# Patient Record
Sex: Female | Born: 1955 | Race: White | Hispanic: No | Marital: Married
Health system: Southern US, Community
[De-identification: ages and names within clinical notes are randomized; demographics above are authoritative.]

## PROBLEM LIST (undated history)

## (undated) DIAGNOSIS — I10 Essential (primary) hypertension: Secondary | ICD-10-CM

## (undated) DIAGNOSIS — E785 Hyperlipidemia, unspecified: Secondary | ICD-10-CM

## (undated) HISTORY — DX: Hyperlipidemia, unspecified: E78.5

## (undated) HISTORY — DX: Essential (primary) hypertension: I10

---

## 2005-04-27 ENCOUNTER — Ambulatory Visit: Payer: Self-pay | Admitting: Internal Medicine

## 2006-05-05 ENCOUNTER — Ambulatory Visit: Payer: Self-pay | Admitting: Internal Medicine

## 2007-08-09 ENCOUNTER — Ambulatory Visit: Payer: Self-pay | Admitting: Internal Medicine

## 2008-12-06 ENCOUNTER — Ambulatory Visit: Payer: Self-pay | Admitting: Internal Medicine

## 2010-06-09 ENCOUNTER — Ambulatory Visit: Payer: Self-pay | Admitting: Internal Medicine

## 2011-12-27 ENCOUNTER — Ambulatory Visit: Payer: Self-pay | Admitting: Internal Medicine

## 2013-06-15 ENCOUNTER — Ambulatory Visit: Payer: Self-pay | Admitting: Internal Medicine

## 2014-08-30 ENCOUNTER — Ambulatory Visit: Payer: Self-pay | Admitting: Internal Medicine

## 2015-01-15 ENCOUNTER — Other Ambulatory Visit: Payer: Self-pay | Admitting: Orthopedic Surgery

## 2015-01-15 DIAGNOSIS — M25561 Pain in right knee: Secondary | ICD-10-CM

## 2015-01-23 ENCOUNTER — Ambulatory Visit: Payer: Self-pay

## 2015-01-24 ENCOUNTER — Ambulatory Visit: Payer: Self-pay

## 2015-08-08 ENCOUNTER — Other Ambulatory Visit: Payer: Self-pay | Admitting: Internal Medicine

## 2015-08-08 DIAGNOSIS — Z0000 Encounter for general adult medical examination without abnormal findings: Secondary | ICD-10-CM

## 2015-09-02 ENCOUNTER — Ambulatory Visit
Admission: RE | Admit: 2015-09-02 | Discharge: 2015-09-02 | Disposition: A | Discharge status: Home / Self Care | Payer: BLUE CROSS/BLUE SHIELD | Source: Ambulatory Visit | Attending: Internal Medicine | Admitting: Internal Medicine

## 2015-09-02 DIAGNOSIS — Z1231 Encounter for screening mammogram for malignant neoplasm of breast: Secondary | ICD-10-CM | POA: Insufficient documentation

## 2015-09-02 DIAGNOSIS — Z0000 Encounter for general adult medical examination without abnormal findings: Secondary | ICD-10-CM

## 2016-08-02 ENCOUNTER — Other Ambulatory Visit: Payer: Self-pay | Admitting: Internal Medicine

## 2016-09-22 ENCOUNTER — Other Ambulatory Visit: Payer: Self-pay | Admitting: Internal Medicine

## 2016-09-22 DIAGNOSIS — Z1231 Encounter for screening mammogram for malignant neoplasm of breast: Secondary | ICD-10-CM

## 2016-12-03 ENCOUNTER — Ambulatory Visit
Admission: RE | Admit: 2016-12-03 | Discharge: 2016-12-03 | Disposition: A | Discharge status: Home / Self Care | Payer: BLUE CROSS/BLUE SHIELD | Source: Ambulatory Visit | Attending: Internal Medicine | Admitting: Internal Medicine

## 2016-12-03 ENCOUNTER — Encounter: Payer: Self-pay | Admitting: Radiology

## 2016-12-03 DIAGNOSIS — Z1231 Encounter for screening mammogram for malignant neoplasm of breast: Secondary | ICD-10-CM | POA: Insufficient documentation

## 2018-03-21 ENCOUNTER — Other Ambulatory Visit: Payer: Self-pay | Admitting: Nurse Practitioner

## 2018-03-21 DIAGNOSIS — Z1231 Encounter for screening mammogram for malignant neoplasm of breast: Secondary | ICD-10-CM

## 2018-05-03 ENCOUNTER — Ambulatory Visit: Payer: BLUE CROSS/BLUE SHIELD

## 2019-09-03 ENCOUNTER — Other Ambulatory Visit: Payer: Self-pay | Admitting: Nurse Practitioner

## 2019-09-03 DIAGNOSIS — Z1231 Encounter for screening mammogram for malignant neoplasm of breast: Secondary | ICD-10-CM

## 2019-09-14 ENCOUNTER — Ambulatory Visit
Admission: RE | Admit: 2019-09-14 | Discharge: 2019-09-14 | Disposition: A | Discharge status: Home / Self Care | Payer: BLUE CROSS/BLUE SHIELD | Source: Ambulatory Visit | Attending: Nurse Practitioner | Admitting: Nurse Practitioner

## 2019-09-14 DIAGNOSIS — Z1231 Encounter for screening mammogram for malignant neoplasm of breast: Secondary | ICD-10-CM | POA: Insufficient documentation

## 2019-09-15 ENCOUNTER — Ambulatory Visit: Payer: BLUE CROSS/BLUE SHIELD | Attending: Internal Medicine

## 2019-09-15 DIAGNOSIS — Z23 Encounter for immunization: Secondary | ICD-10-CM

## 2019-09-15 NOTE — Progress Notes (Signed)
   Covid-19 Vaccination Clinic  Name:  Kathleen Barrera    MRN: [id number] DOB: January 01, 1956  09/15/2019  [name] was observed post Covid-19 immunization for 15 minutes without incident. She was provided with Vaccine Information Sheet and instruction to access the V-Safe system.   [name] was instructed to call 911 with any severe reactions post vaccine: Marland Kitchen Difficulty breathing  . Swelling of face and throat  . A fast heartbeat  . A bad rash all over body  . Dizziness and weakness   Immunizations Administered    Name Date Dose VIS Date Route   Moderna COVID-19 Vaccine 09/15/2019 10:59 AM 0.5 mL 05/29/2019 Intramuscular   Manufacturer: Moderna   Lot: [id number]   NDC: 73736-681-59

## 2019-10-17 ENCOUNTER — Ambulatory Visit: Payer: Self-pay | Attending: Internal Medicine

## 2019-10-17 DIAGNOSIS — Z23 Encounter for immunization: Secondary | ICD-10-CM

## 2019-10-17 NOTE — Progress Notes (Signed)
   Covid-19 Vaccination Clinic  Name:  JACQUELINNE SPEAK    MRN: [id number] DOB: Jul 07, 1955  10/17/2019  Ms. Hoeppner was observed post Covid-19 immunization for 15 minutes without incident. She was provided with Vaccine Information Sheet and instruction to access the V-Safe system.   Ms. Pelle was instructed to call 911 with any severe reactions post vaccine: Marland Kitchen Difficulty breathing  . Swelling of face and throat  . A fast heartbeat  . A bad rash all over body  . Dizziness and weakness   Immunizations Administered    Name Date Dose VIS Date Route   Moderna COVID-19 Vaccine 10/17/2019 10:58 AM 0.5 mL 05/2019 Intramuscular   Manufacturer: Moderna   Lot: [id number]   NDC: 39532-023-34

## 2021-03-22 IMAGING — MG DIGITAL SCREENING BILAT W/ TOMO W/ CAD
8 series · 8 of 24 positions shown · non-contrast
Comparison: Previous exam(s).

ACR Breast Density Category a: The breast tissue is almost entirely
fatty.

CLINICAL DATA: Screening.

EXAM:
DIGITAL SCREENING BILATERAL MAMMOGRAM WITH TOMO AND CAD

[L MLO synth-2D]
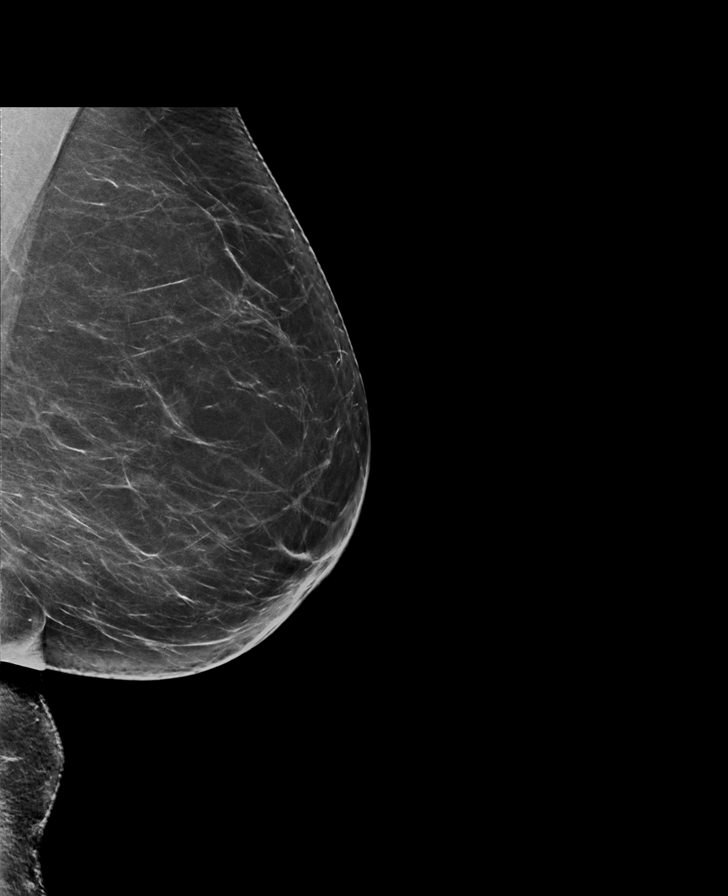

[R MLO synth-2D]
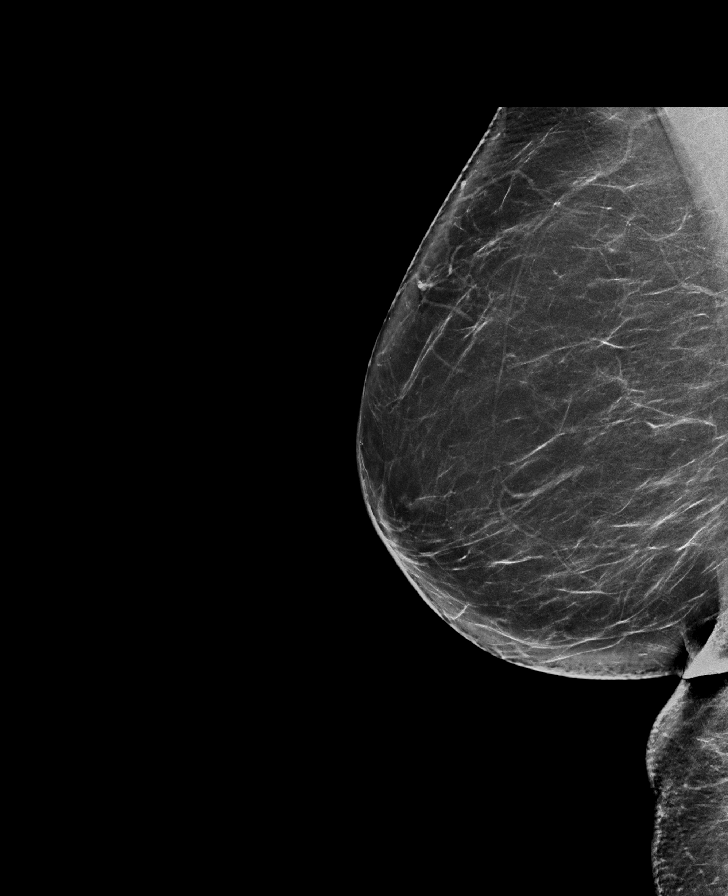

[R CC synth-2D]
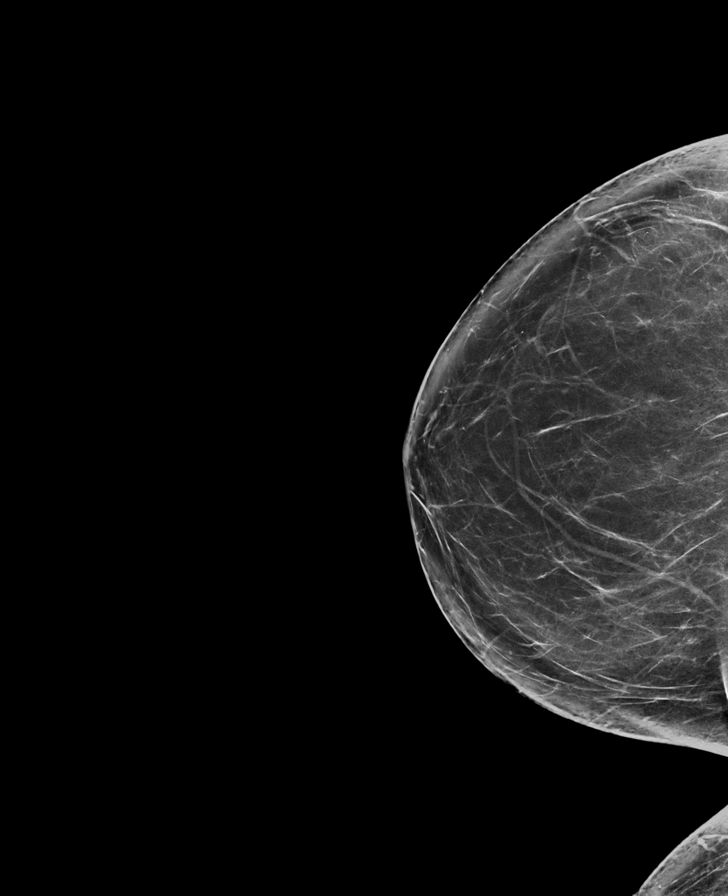

[L CC synth-2D]
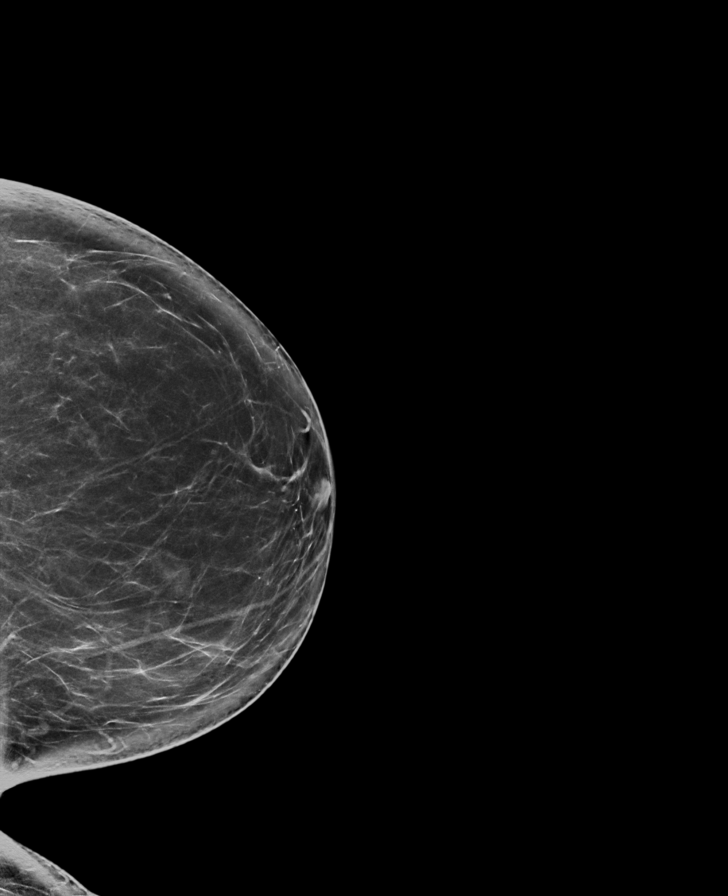

[L CC tomo · tomo slice 39/77.0]
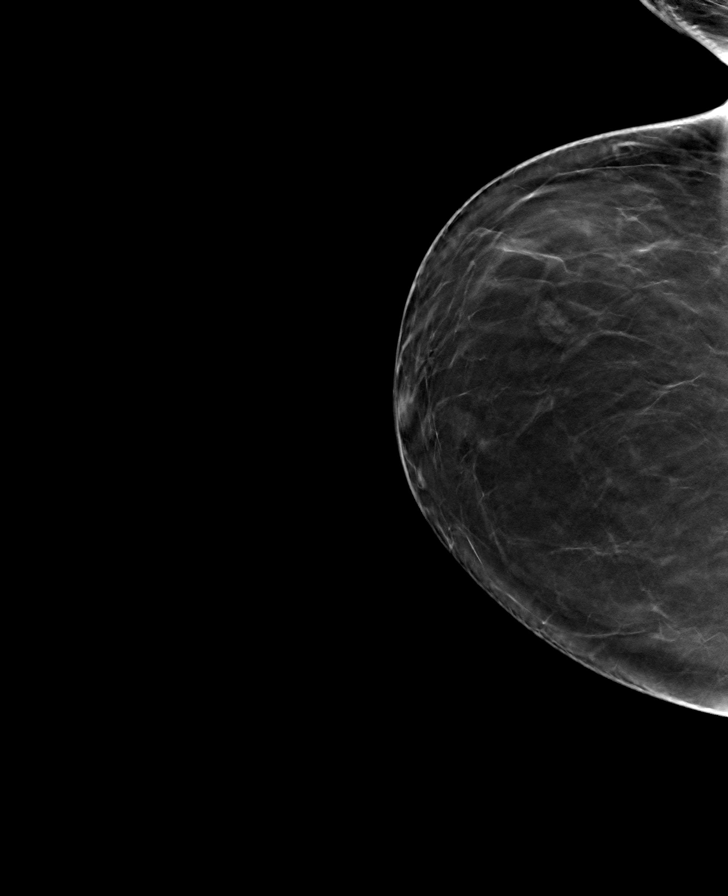

[L MLO tomo · tomo slice 41/81.0]
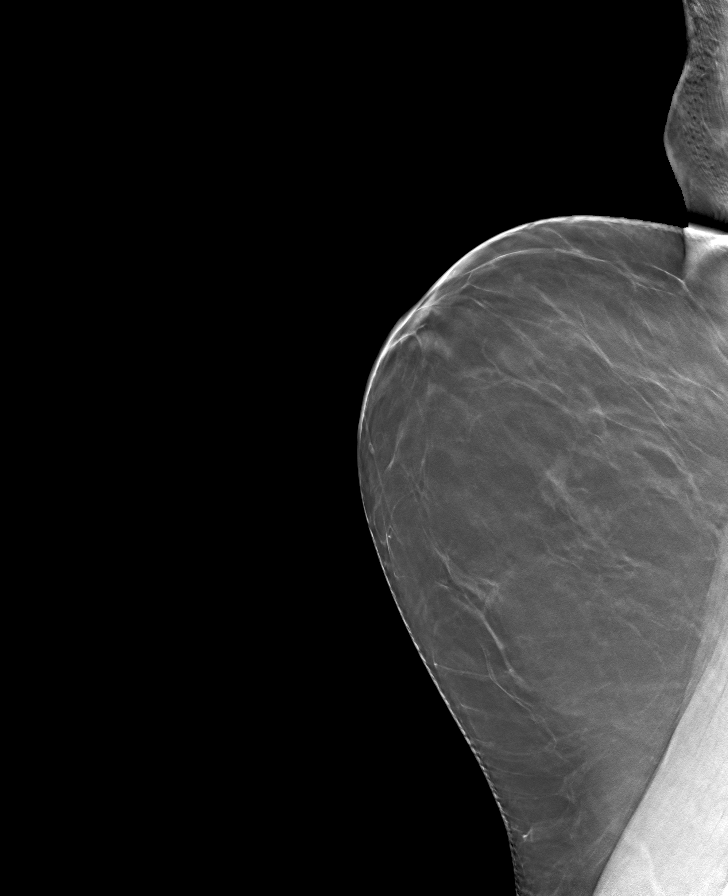

[R CC tomo · tomo slice 39/77.0]
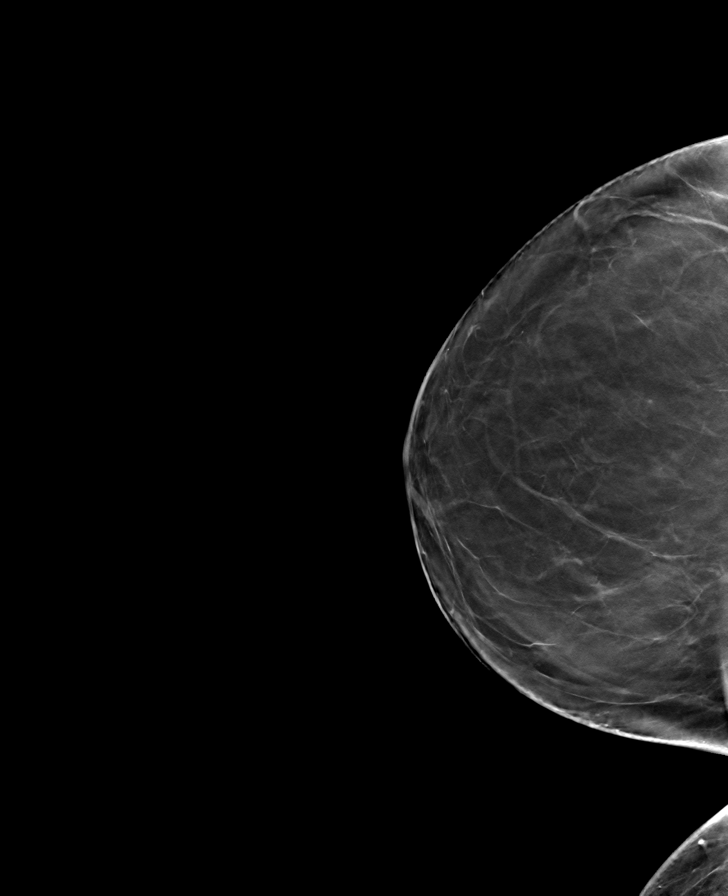

[R MLO tomo · tomo slice 41/82.0]
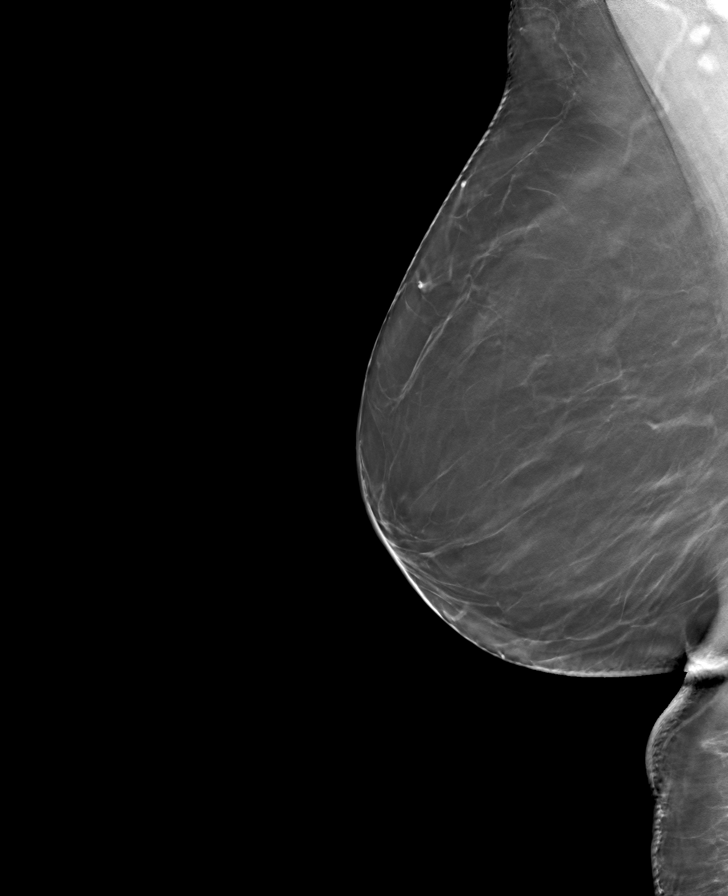

[8 of 24 positions shown; findings below may reference images not displayed]

FINDINGS: There are no findings suspicious for malignancy. Images were
processed with CAD.
IMPRESSION: No mammographic evidence of malignancy. A result letter of this
screening mammogram will be mailed directly to the patient.

RECOMMENDATION:
Screening mammogram in one year. (Code:[id number])

BI-RADS CATEGORY  1: Negative.

## 2021-04-03 DIAGNOSIS — R7301 Impaired fasting glucose: Secondary | ICD-10-CM | POA: Diagnosis not present

## 2021-04-03 DIAGNOSIS — E785 Hyperlipidemia, unspecified: Secondary | ICD-10-CM | POA: Diagnosis not present

## 2021-04-03 DIAGNOSIS — I10 Essential (primary) hypertension: Secondary | ICD-10-CM | POA: Diagnosis not present

## 2021-04-10 ENCOUNTER — Other Ambulatory Visit: Payer: Self-pay | Admitting: Nurse Practitioner

## 2021-04-10 DIAGNOSIS — Z23 Encounter for immunization: Secondary | ICD-10-CM | POA: Diagnosis not present

## 2021-04-10 DIAGNOSIS — E785 Hyperlipidemia, unspecified: Secondary | ICD-10-CM | POA: Diagnosis not present

## 2021-04-10 DIAGNOSIS — F32A Depression, unspecified: Secondary | ICD-10-CM | POA: Diagnosis not present

## 2021-04-10 DIAGNOSIS — R7301 Impaired fasting glucose: Secondary | ICD-10-CM | POA: Diagnosis not present

## 2021-04-10 DIAGNOSIS — Z1231 Encounter for screening mammogram for malignant neoplasm of breast: Secondary | ICD-10-CM

## 2021-04-10 DIAGNOSIS — I10 Essential (primary) hypertension: Secondary | ICD-10-CM | POA: Diagnosis not present

## 2021-04-10 DIAGNOSIS — F411 Generalized anxiety disorder: Secondary | ICD-10-CM | POA: Diagnosis not present

## 2021-04-10 DIAGNOSIS — M1990 Unspecified osteoarthritis, unspecified site: Secondary | ICD-10-CM | POA: Diagnosis not present

## 2021-04-28 ENCOUNTER — Other Ambulatory Visit: Payer: Self-pay

## 2021-04-28 ENCOUNTER — Ambulatory Visit
Admission: RE | Admit: 2021-04-28 | Discharge: 2021-04-28 | Disposition: A | Discharge status: Home / Self Care | Payer: PPO | Source: Ambulatory Visit | Attending: Nurse Practitioner | Admitting: Nurse Practitioner

## 2021-04-28 DIAGNOSIS — Z1231 Encounter for screening mammogram for malignant neoplasm of breast: Secondary | ICD-10-CM | POA: Diagnosis not present

## 2021-10-16 DIAGNOSIS — R7301 Impaired fasting glucose: Secondary | ICD-10-CM | POA: Diagnosis not present

## 2021-10-16 DIAGNOSIS — I10 Essential (primary) hypertension: Secondary | ICD-10-CM | POA: Diagnosis not present

## 2021-10-16 DIAGNOSIS — E785 Hyperlipidemia, unspecified: Secondary | ICD-10-CM | POA: Diagnosis not present

## 2021-10-23 DIAGNOSIS — I10 Essential (primary) hypertension: Secondary | ICD-10-CM | POA: Diagnosis not present

## 2021-10-23 DIAGNOSIS — E785 Hyperlipidemia, unspecified: Secondary | ICD-10-CM | POA: Diagnosis not present

## 2021-10-23 DIAGNOSIS — F411 Generalized anxiety disorder: Secondary | ICD-10-CM | POA: Diagnosis not present

## 2021-10-23 DIAGNOSIS — R7303 Prediabetes: Secondary | ICD-10-CM | POA: Diagnosis not present

## 2022-03-16 DIAGNOSIS — B372 Candidiasis of skin and nail: Secondary | ICD-10-CM | POA: Diagnosis not present

## 2022-03-16 DIAGNOSIS — B029 Zoster without complications: Secondary | ICD-10-CM | POA: Diagnosis not present

## 2022-03-16 DIAGNOSIS — I10 Essential (primary) hypertension: Secondary | ICD-10-CM | POA: Diagnosis not present

## 2022-03-16 DIAGNOSIS — R7303 Prediabetes: Secondary | ICD-10-CM | POA: Diagnosis not present

## 2022-04-23 DIAGNOSIS — R7303 Prediabetes: Secondary | ICD-10-CM | POA: Diagnosis not present

## 2022-04-23 DIAGNOSIS — E785 Hyperlipidemia, unspecified: Secondary | ICD-10-CM | POA: Diagnosis not present

## 2022-04-23 DIAGNOSIS — I10 Essential (primary) hypertension: Secondary | ICD-10-CM | POA: Diagnosis not present

## 2022-04-30 DIAGNOSIS — Z0001 Encounter for general adult medical examination with abnormal findings: Secondary | ICD-10-CM | POA: Diagnosis not present

## 2022-04-30 DIAGNOSIS — R7303 Prediabetes: Secondary | ICD-10-CM | POA: Diagnosis not present

## 2022-04-30 DIAGNOSIS — J302 Other seasonal allergic rhinitis: Secondary | ICD-10-CM | POA: Diagnosis not present

## 2022-04-30 DIAGNOSIS — I10 Essential (primary) hypertension: Secondary | ICD-10-CM | POA: Diagnosis not present

## 2022-04-30 DIAGNOSIS — J309 Allergic rhinitis, unspecified: Secondary | ICD-10-CM | POA: Diagnosis not present

## 2022-04-30 DIAGNOSIS — E785 Hyperlipidemia, unspecified: Secondary | ICD-10-CM | POA: Diagnosis not present

## 2022-08-26 ENCOUNTER — Other Ambulatory Visit (INDEPENDENT_AMBULATORY_CARE_PROVIDER_SITE_OTHER): Payer: PPO | Admitting: Nurse Practitioner

## 2022-08-26 ENCOUNTER — Ambulatory Visit (INDEPENDENT_AMBULATORY_CARE_PROVIDER_SITE_OTHER): Payer: PPO | Admitting: Nurse Practitioner

## 2022-08-26 VITALS — BP 126/82 | HR 92 | Ht 64.0 in | Wt 145.0 lb

## 2022-08-26 DIAGNOSIS — M778 Other enthesopathies, not elsewhere classified: Secondary | ICD-10-CM

## 2022-08-26 DIAGNOSIS — G5601 Carpal tunnel syndrome, right upper limb: Secondary | ICD-10-CM | POA: Insufficient documentation

## 2022-08-26 DIAGNOSIS — M25511 Pain in right shoulder: Secondary | ICD-10-CM | POA: Insufficient documentation

## 2022-08-26 HISTORY — DX: Other enthesopathies, not elsewhere classified: M77.8

## 2022-08-26 HISTORY — DX: Pain in right shoulder: M25.511

## 2022-08-26 MED ORDER — METHYLPREDNISOLONE ACETATE 40 MG/ML IJ SUSP
40.0000 mg | Freq: Once | INTRAMUSCULAR | Status: AC
  Administered 2022-08-26: 80 mg via INTRAMUSCULAR

## 2022-08-26 NOTE — Patient Instructions (Signed)
1) Do not lift anything heavier than 5 lbs daily 2) right wrist daily x 2 weeks except in shower 3) Voltaren gel every 8 hours to all 3 joints x 2 weeks 4) Meloxicam 5) Steroid injection in right arm today

## 2022-08-26 NOTE — Addendum Note (Signed)
Addended by: Wyvonna Plum on: 08/26/2022 10:06 AM   Modules accepted: Orders

## 2022-08-26 NOTE — Progress Notes (Signed)
Established Patient Office Visit  Subjective:  Patient ID: Kathleen Barrera, female    DOB: 13-Jan-1956  Age: 67 y.o. MRN: [id number]  Chief Complaint  Patient presents with   Acute Visit    R Shoulder Pain    Acute visit, right shoulder pain x 3 weeks, no injury, severe pain.  Arthritis pain OTC, eases off.  Positive Tinel's sign, positive tennis elbow sign, positive right posterior shoulder tendonitis sign,     No past medical history on file.  Social History   Socioeconomic History   Marital status: Married    Spouse name: Not on file   Number of children: Not on file   Years of education: Not on file   Highest education level: Not on file  Occupational History   Not on file  Tobacco Use   Smoking status: Not on file   Smokeless tobacco: Not on file  Substance and Sexual Activity   Alcohol use: Not on file   Drug use: Not on file   Sexual activity: Not on file  Other Topics Concern   Not on file  Social History Narrative   Not on file   Social Determinants of Health   Financial Resource Strain: Not on file  Food Insecurity: Not on file  Transportation Needs: Not on file  Physical Activity: Not on file  Stress: Not on file  Social Connections: Not on file  Intimate Partner Violence: Not on file    No family history on file.  Allergies  Allergen Reactions   Hydrocodone-Acetaminophen     Review of Systems  Constitutional: Negative.   HENT: Negative.    Eyes: Negative.   Respiratory: Negative.    Cardiovascular: Negative.   Gastrointestinal: Negative.   Genitourinary: Negative.   Musculoskeletal:  Positive for myalgias.  Skin: Negative.   Neurological: Negative.   Endo/Heme/Allergies: Negative.   Psychiatric/Behavioral: Negative.         Objective:   BP 126/82   Pulse 92   Ht '5\' 4"'$  (1.626 m)   Wt 145 lb (65.8 kg)   SpO2 98%   BMI 24.89 kg/m   Vitals:   08/26/22 0936  BP: 126/82  Pulse: 92  Height: '5\' 4"'$  (1.626 m)  Weight: 145 lb  (65.8 kg)  SpO2: 98%  BMI (Calculated): 24.88    Physical Exam Vitals reviewed.  Constitutional:      Appearance: Normal appearance.  HENT:     Head: Normocephalic.     Nose: Nose normal.     Mouth/Throat:     Mouth: Mucous membranes are moist.  Eyes:     Pupils: Pupils are equal, round, and reactive to light.  Cardiovascular:     Rate and Rhythm: Normal rate and regular rhythm.  Pulmonary:     Effort: Pulmonary effort is normal.     Breath sounds: Normal breath sounds.  Abdominal:     General: Bowel sounds are normal.     Palpations: Abdomen is soft.  Musculoskeletal:        General: Tenderness present.     Cervical back: Normal range of motion and neck supple.  Skin:    General: Skin is warm and dry.  Neurological:     Mental Status: She is alert and oriented to person, place, and time.  Psychiatric:        Mood and Affect: Mood normal.        Behavior: Behavior normal.      No results found for any visits  on 08/26/22.  No results found for this or any previous visit (from the past 2160 hour(s)).    Assessment & Plan:   Problem List Items Addressed This Visit       Nervous and Auditory   Carpal tunnel syndrome of right wrist   Relevant Medications   citalopram (CELEXA) 40 MG tablet   cyclobenzaprine (FLEXERIL) 10 MG tablet     Musculoskeletal and Integument   Tendonitis of elbow, right     Other   Acute pain of right shoulder - Primary    Return in about 1 week (around 09/02/2022).   Total time spent: 30 minutes  Evern Bio, NP  08/26/2022

## 2022-08-27 ENCOUNTER — Telehealth: Payer: Self-pay

## 2022-08-27 ENCOUNTER — Other Ambulatory Visit: Payer: Self-pay

## 2022-08-27 MED ORDER — MELOXICAM 7.5 MG PO TABS
7.5000 mg | ORAL_TABLET | Freq: Every day | ORAL | 0 refills | Status: DC
Start: 2022-08-27 — End: 2022-08-27

## 2022-08-27 MED ORDER — MELOXICAM 7.5 MG PO TABS: 7.5000 mg | ORAL_TABLET | Freq: Every day | ORAL | 0 refills | Status: DC

## 2022-08-27 NOTE — Telephone Encounter (Signed)
Patient called stating that [facility] electronic rx system is still down and she is asking for you to print her rxs and she will come pick them up today

## 2022-08-27 NOTE — Telephone Encounter (Signed)
What prescriptions?

## 2022-08-27 NOTE — Telephone Encounter (Signed)
I have reprinted the one script, she is to purchase OTC meds like voltaren

## 2022-09-03 ENCOUNTER — Ambulatory Visit (INDEPENDENT_AMBULATORY_CARE_PROVIDER_SITE_OTHER): Payer: PPO | Admitting: Nurse Practitioner

## 2022-09-03 VITALS — BP 134/82 | HR 81 | Ht 64.0 in | Wt 147.4 lb

## 2022-09-03 DIAGNOSIS — M778 Other enthesopathies, not elsewhere classified: Secondary | ICD-10-CM | POA: Diagnosis not present

## 2022-09-03 DIAGNOSIS — M25511 Pain in right shoulder: Secondary | ICD-10-CM | POA: Diagnosis not present

## 2022-09-03 DIAGNOSIS — G5601 Carpal tunnel syndrome, right upper limb: Secondary | ICD-10-CM | POA: Diagnosis not present

## 2022-09-03 NOTE — Progress Notes (Signed)
Established Patient Office Visit  Subjective:  Patient ID: Kathleen Barrera, female    DOB: 1955/12/11  Age: 67 y.o. MRN: [id number]  Chief Complaint  Patient presents with   Follow-up    1 week follow up    1 week follow up, light duty at work, feels pain is improved.  She has had this pain in her right shoulder 2 yrs prior and heavy lifting flares so patient will not be lifting ice bucket or heavy items.  She continues to wear right hand/wrist brace.     No past medical history on file.  No past surgical history on file.  Social History   Socioeconomic History   Marital status: Married    Spouse name: Not on file   Number of children: Not on file   Years of education: Not on file   Highest education level: Not on file  Occupational History   Not on file  Tobacco Use   Smoking status: Not on file   Smokeless tobacco: Not on file  Substance and Sexual Activity   Alcohol use: Not on file   Drug use: Not on file   Sexual activity: Not on file  Other Topics Concern   Not on file  Social History Narrative   Not on file   Social Determinants of Health   Financial Resource Strain: Not on file  Food Insecurity: Not on file  Transportation Needs: Not on file  Physical Activity: Not on file  Stress: Not on file  Social Connections: Not on file  Intimate Partner Violence: Not on file    No family history on file.  Allergies  Allergen Reactions   Hydrocodone-Acetaminophen     Review of Systems  Constitutional: Negative.   HENT: Negative.    Eyes: Negative.   Respiratory: Negative.    Cardiovascular: Negative.   Gastrointestinal: Negative.   Genitourinary: Negative.   Musculoskeletal:  Positive for joint pain and myalgias.  Skin: Negative.   Neurological: Negative.   Endo/Heme/Allergies: Negative.   Psychiatric/Behavioral: Negative.         Objective:   BP 134/82   Pulse 81   Ht '5\' 4"'$  (1.626 m)   Wt 147 lb 6.4 oz (66.9 kg)   SpO2 96%   BMI 25.30  kg/m   Vitals:   09/03/22 1100  BP: 134/82  Pulse: 81  Height: '5\' 4"'$  (1.626 m)  Weight: 147 lb 6.4 oz (66.9 kg)  SpO2: 96%  BMI (Calculated): 25.29    Physical Exam Vitals reviewed.  Constitutional:      Appearance: Normal appearance.  HENT:     Head: Normocephalic.     Nose: Nose normal.     Mouth/Throat:     Mouth: Mucous membranes are moist.  Eyes:     Pupils: Pupils are equal, round, and reactive to light.  Cardiovascular:     Rate and Rhythm: Normal rate and regular rhythm.  Pulmonary:     Effort: Pulmonary effort is normal.     Breath sounds: Normal breath sounds.  Abdominal:     General: Bowel sounds are normal.     Palpations: Abdomen is soft.  Musculoskeletal:        General: Tenderness present.     Cervical back: Normal range of motion and neck supple.  Skin:    General: Skin is warm and dry.  Neurological:     Mental Status: She is alert and oriented to person, place, and time.  Psychiatric:  Mood and Affect: Mood normal.        Behavior: Behavior normal.      No results found for any visits on 09/03/22.  No results found for this or any previous visit (from the past 2160 hour(s)).    Assessment & Plan:   Problem List Items Addressed This Visit       Nervous and Auditory   Carpal tunnel syndrome of right wrist     Musculoskeletal and Integument   Tendonitis of elbow, right     Other   Acute pain of right shoulder - Primary    Return for Pt already has follow up but needs PSW for My Chart, has username thanks.   Total time spent: 35 minutes  Evern Bio, NP  09/03/2022

## 2022-09-03 NOTE — Patient Instructions (Addendum)
1) Needs limited duty note for work, heavy lifting is causing continuous tendonitis and pain 2) Right wrist brace, take off at night and wear during the day x 2 weeks 3) Hold on PT for now, patient improving

## 2022-09-14 ENCOUNTER — Encounter: Payer: Self-pay | Admitting: Nurse Practitioner

## 2022-09-23 ENCOUNTER — Other Ambulatory Visit: Payer: Self-pay | Admitting: Nurse Practitioner

## 2022-09-27 ENCOUNTER — Other Ambulatory Visit: Payer: Self-pay

## 2022-09-27 ENCOUNTER — Other Ambulatory Visit: Payer: Self-pay | Admitting: Nurse Practitioner

## 2022-09-27 DIAGNOSIS — I10 Essential (primary) hypertension: Secondary | ICD-10-CM

## 2022-09-27 MED ORDER — AMLODIPINE BESYLATE 5 MG PO TABS: 5.0000 mg | ORAL_TABLET | Freq: Every morning | ORAL | 3 refills | Status: DC

## 2022-09-27 MED ORDER — AMLODIPINE BESYLATE 5 MG PO TABS
5.0000 mg | ORAL_TABLET | Freq: Every morning | ORAL | 3 refills | Status: DC
  Filled 2022-09-27: qty 90, 90d supply, fill #0

## 2022-09-30 ENCOUNTER — Other Ambulatory Visit: Payer: Self-pay | Admitting: Nurse Practitioner

## 2022-10-05 ENCOUNTER — Encounter: Payer: Self-pay | Admitting: Nurse Practitioner

## 2022-10-19 ENCOUNTER — Other Ambulatory Visit: Payer: Self-pay | Admitting: Nurse Practitioner

## 2022-10-29 ENCOUNTER — Other Ambulatory Visit: Payer: PPO

## 2022-10-29 ENCOUNTER — Other Ambulatory Visit: Payer: Self-pay | Admitting: Nurse Practitioner

## 2022-10-29 DIAGNOSIS — I10 Essential (primary) hypertension: Secondary | ICD-10-CM | POA: Diagnosis not present

## 2022-10-29 DIAGNOSIS — E785 Hyperlipidemia, unspecified: Secondary | ICD-10-CM | POA: Diagnosis not present

## 2022-10-29 DIAGNOSIS — R7303 Prediabetes: Secondary | ICD-10-CM | POA: Diagnosis not present

## 2022-10-30 LAB — COMPREHENSIVE METABOLIC PANEL
ALT: 26 IU/L (ref 0–32)
AST: 23 IU/L (ref 0–40)
Albumin/Globulin Ratio: 2.6 — ABNORMAL HIGH (ref 1.2–2.2)
Albumin: 4.9 g/dL (ref 3.9–4.9)
Alkaline Phosphatase: 111 IU/L (ref 44–121)
BUN/Creatinine Ratio: 14 (ref 12–28)
BUN: 13 mg/dL (ref 8–27)
Bilirubin Total: 0.8 mg/dL (ref 0.0–1.2)
CO2: 23 mmol/L (ref 20–29)
Calcium: 10.1 mg/dL (ref 8.7–10.3)
Chloride: 97 mmol/L (ref 96–106)
Creatinine, Ser: 0.9 mg/dL (ref 0.57–1.00)
Globulin, Total: 1.9 g/dL (ref 1.5–4.5)
Glucose: 123 mg/dL — ABNORMAL HIGH (ref 70–99)
Potassium: 4 mmol/L (ref 3.5–5.2)
Sodium: 137 mmol/L (ref 134–144)
Total Protein: 6.8 g/dL (ref 6.0–8.5)
eGFR: 71 mL/min/{1.73_m2} (ref >59)

## 2022-10-30 LAB — LIPID PANEL W/O CHOL/HDL RATIO
Cholesterol, Total: 165 mg/dL (ref 100–199)
HDL: 90 mg/dL (ref >39)
LDL Chol Calc (NIH): 65 mg/dL (ref 0–99)
Triglycerides: 49 mg/dL (ref 0–149)
VLDL Cholesterol Cal: 10 mg/dL (ref 5–40)

## 2022-10-30 LAB — HGB A1C W/O EAG: Hgb A1c MFr Bld: 5.8 % — ABNORMAL HIGH (ref 4.8–5.6)

## 2022-10-30 LAB — TSH: TSH: 1.19 u[IU]/mL (ref 0.450–4.500)

## 2022-11-05 ENCOUNTER — Other Ambulatory Visit: Payer: Self-pay | Admitting: Nurse Practitioner

## 2022-11-05 ENCOUNTER — Ambulatory Visit (INDEPENDENT_AMBULATORY_CARE_PROVIDER_SITE_OTHER): Payer: PPO | Admitting: Nurse Practitioner

## 2022-11-05 VITALS — BP 158/88 | HR 79 | Ht 64.0 in | Wt 149.8 lb

## 2022-11-05 DIAGNOSIS — F419 Anxiety disorder, unspecified: Secondary | ICD-10-CM | POA: Diagnosis not present

## 2022-11-05 DIAGNOSIS — I10 Essential (primary) hypertension: Secondary | ICD-10-CM

## 2022-11-05 DIAGNOSIS — R7303 Prediabetes: Secondary | ICD-10-CM | POA: Diagnosis not present

## 2022-11-05 DIAGNOSIS — E663 Overweight: Secondary | ICD-10-CM

## 2022-11-05 NOTE — Patient Instructions (Signed)
1) Follow up appt in 6 months, fasting labs prior  

## 2022-11-05 NOTE — Progress Notes (Signed)
Established Patient Office Visit  Subjective:  Patient ID: Kathleen Barrera, female    DOB: 09/16/1955  Age: 67 y.o. MRN: [id number]  Chief Complaint  Patient presents with   Follow-up    6 month follow up, discuss lab results.    Follow up appt and review of recent fasting labs, A1c at 5.8% and LDL is at 64 mg/dl. Patient having increased life stressors with her husband who recently had a stroke several weeks ago.    No other concerns at this time.   No past medical history on file.  No past surgical history on file.  Social History   Socioeconomic History   Marital status: Married    Spouse name: Not on file   Number of children: Not on file   Years of education: Not on file   Highest education level: Not on file  Occupational History   Not on file  Tobacco Use   Smoking status: Not on file   Smokeless tobacco: Not on file  Substance and Sexual Activity   Alcohol use: Not on file   Drug use: Not on file   Sexual activity: Not on file  Other Topics Concern   Not on file  Social History Narrative   Not on file   Social Determinants of Health   Financial Resource Strain: Not on file  Food Insecurity: Not on file  Transportation Needs: Not on file  Physical Activity: Not on file  Stress: Not on file  Social Connections: Not on file  Intimate Partner Violence: Not on file    No family history on file.  Allergies  Allergen Reactions   Hydrocodone-Acetaminophen     Review of Systems  Constitutional: Negative.   HENT: Negative.    Eyes: Negative.   Respiratory: Negative.    Cardiovascular: Negative.   Gastrointestinal: Negative.   Genitourinary: Negative.   Musculoskeletal: Negative.   Skin: Negative.   Neurological: Negative.   Endo/Heme/Allergies: Negative.   Psychiatric/Behavioral: Negative.         Objective:   BP (!) 158/88   Pulse 79   Ht 5\' 4"  (1.626 m)   Wt 149 lb 12.8 oz (67.9 kg)   SpO2 97%   BMI 25.71 kg/m   Vitals:    11/05/22 0839  BP: (!) 158/88  Pulse: 79  Height: 5\' 4"  (1.626 m)  Weight: 149 lb 12.8 oz (67.9 kg)  SpO2: 97%  BMI (Calculated): 25.7    Physical Exam Vitals reviewed.  Constitutional:      Appearance: Normal appearance.  HENT:     Head: Normocephalic.     Nose: Nose normal.     Mouth/Throat:     Mouth: Mucous membranes are moist.  Eyes:     Pupils: Pupils are equal, round, and reactive to light.  Cardiovascular:     Rate and Rhythm: Normal rate and regular rhythm.     Pulses: Normal pulses.     Heart sounds: Normal heart sounds.  Pulmonary:     Effort: Pulmonary effort is normal.     Breath sounds: Normal breath sounds.  Abdominal:     General: Bowel sounds are normal.     Palpations: Abdomen is soft.  Musculoskeletal:        General: Normal range of motion.     Cervical back: Normal range of motion and neck supple.  Skin:    General: Skin is warm and dry.  Neurological:     Mental Status: She is alert and  oriented to person, place, and time.      No results found for any visits on 11/05/22.  Recent Results (from the past 2160 hour(s))  Comprehensive metabolic panel     Status: Abnormal   Collection Time: 10/29/22  8:55 AM  Result Value Ref Range   Glucose 123 (H) 70 - 99 mg/dL   BUN 13 8 - 27 mg/dL   Creatinine, Ser 1.61 0.57 - 1.00 mg/dL   eGFR 71 >09 UE/AVW/0.98   BUN/Creatinine Ratio 14 12 - 28   Sodium 137 134 - 144 mmol/L   Potassium 4.0 3.5 - 5.2 mmol/L   Chloride 97 96 - 106 mmol/L   CO2 23 20 - 29 mmol/L   Calcium 10.1 8.7 - 10.3 mg/dL   Total Protein 6.8 6.0 - 8.5 g/dL   Albumin 4.9 3.9 - 4.9 g/dL   Globulin, Total 1.9 1.5 - 4.5 g/dL   Albumin/Globulin Ratio 2.6 (H) 1.2 - 2.2   Bilirubin Total 0.8 0.0 - 1.2 mg/dL   Alkaline Phosphatase 111 44 - 121 IU/L   AST 23 0 - 40 IU/L   ALT 26 0 - 32 IU/L  Lipid Panel w/o Chol/HDL Ratio     Status: None   Collection Time: 10/29/22  8:55 AM  Result Value Ref Range   Cholesterol, Total 165 100 - 199  mg/dL   Triglycerides 49 0 - 149 mg/dL   HDL 90 >11 mg/dL   VLDL Cholesterol Cal 10 5 - 40 mg/dL   LDL Chol Calc (NIH) 65 0 - 99 mg/dL  Hgb B1Y w/o eAG     Status: Abnormal   Collection Time: 10/29/22  8:55 AM  Result Value Ref Range   Hgb A1c MFr Bld 5.8 (H) 4.8 - 5.6 %    Comment:          Prediabetes: 5.7 - 6.4          Diabetes: >6.4          Glycemic control for adults with diabetes: <7.0   TSH     Status: None   Collection Time: 10/29/22  8:55 AM  Result Value Ref Range   TSH 1.190 0.450 - 4.500 uIU/mL      Assessment & Plan:   Problem List Items Addressed This Visit   None   No follow-ups on file.   Total time spent: 35 minutes  Orson Eva, NP  11/05/2022

## 2022-12-02 ENCOUNTER — Encounter: Payer: Self-pay | Admitting: Nurse Practitioner

## 2022-12-06 MED ORDER — FLUCONAZOLE 150 MG PO TABS: 150.0000 mg | ORAL_TABLET | Freq: Every day | ORAL | 0 refills | Status: DC

## 2022-12-21 ENCOUNTER — Other Ambulatory Visit: Payer: Self-pay | Admitting: Nurse Practitioner

## 2023-02-15 ENCOUNTER — Other Ambulatory Visit: Payer: Self-pay

## 2023-02-18 ENCOUNTER — Other Ambulatory Visit: Payer: Self-pay

## 2023-02-21 ENCOUNTER — Other Ambulatory Visit: Payer: Self-pay

## 2023-03-01 ENCOUNTER — Other Ambulatory Visit: Payer: Self-pay

## 2023-03-01 MED ORDER — MELOXICAM 7.5 MG PO TABS: 7.5000 mg | ORAL_TABLET | Freq: Every day | ORAL | 0 refills | Status: DC

## 2023-03-22 ENCOUNTER — Other Ambulatory Visit: Payer: Self-pay | Admitting: Nurse Practitioner

## 2023-03-22 MED ORDER — ROSUVASTATIN CALCIUM 20 MG PO TABS: 20.0000 mg | ORAL_TABLET | Freq: Every day | ORAL | 1 refills | Status: DC

## 2023-04-15 ENCOUNTER — Ambulatory Visit: Payer: PPO | Admitting: Cardiology

## 2023-05-06 ENCOUNTER — Other Ambulatory Visit: Payer: PPO

## 2023-05-06 DIAGNOSIS — E785 Hyperlipidemia, unspecified: Secondary | ICD-10-CM

## 2023-05-06 DIAGNOSIS — I10 Essential (primary) hypertension: Secondary | ICD-10-CM | POA: Diagnosis not present

## 2023-05-06 DIAGNOSIS — R7303 Prediabetes: Secondary | ICD-10-CM | POA: Diagnosis not present

## 2023-05-06 DIAGNOSIS — E663 Overweight: Secondary | ICD-10-CM | POA: Diagnosis not present

## 2023-05-07 LAB — CMP14+EGFR
ALT: 26 [IU]/L (ref 0–32)
AST: 21 [IU]/L (ref 0–40)
Albumin: 4.5 g/dL (ref 3.9–4.9)
Alkaline Phosphatase: 104 [IU]/L (ref 44–121)
BUN/Creatinine Ratio: 14 (ref 12–28)
BUN: 12 mg/dL (ref 8–27)
Bilirubin Total: 0.7 mg/dL (ref 0.0–1.2)
CO2: 24 mmol/L (ref 20–29)
Calcium: 9.7 mg/dL (ref 8.7–10.3)
Chloride: 97 mmol/L (ref 96–106)
Creatinine, Ser: 0.86 mg/dL (ref 0.57–1.00)
Globulin, Total: 2 g/dL (ref 1.5–4.5)
Glucose: 116 mg/dL — ABNORMAL HIGH (ref 70–99)
Potassium: 4.5 mmol/L (ref 3.5–5.2)
Sodium: 135 mmol/L (ref 134–144)
Total Protein: 6.5 g/dL (ref 6.0–8.5)
eGFR: 74 mL/min/{1.73_m2} (ref >59)

## 2023-05-07 LAB — LIPID PANEL
Chol/HDL Ratio: 1.9 ratio (ref 0.0–4.4)
Cholesterol, Total: 140 mg/dL (ref 100–199)
HDL: 75 mg/dL (ref >39)
LDL Chol Calc (NIH): 54 mg/dL (ref 0–99)
Triglycerides: 46 mg/dL (ref 0–149)
VLDL Cholesterol Cal: 11 mg/dL (ref 5–40)

## 2023-05-07 LAB — HEMOGLOBIN A1C
Est. average glucose Bld gHb Est-mCnc: 117 mg/dL
Hgb A1c MFr Bld: 5.7 % — ABNORMAL HIGH (ref 4.8–5.6)

## 2023-05-07 LAB — TSH: TSH: 1.26 u[IU]/mL (ref 0.450–4.500)

## 2023-05-13 ENCOUNTER — Ambulatory Visit (INDEPENDENT_AMBULATORY_CARE_PROVIDER_SITE_OTHER): Payer: PPO | Admitting: Cardiology

## 2023-05-13 ENCOUNTER — Encounter: Payer: Self-pay | Admitting: Cardiology

## 2023-05-13 ENCOUNTER — Ambulatory Visit: Payer: PPO | Admitting: Cardiology

## 2023-05-13 VITALS — BP 122/66 | HR 96 | Ht 64.5 in | Wt 148.0 lb

## 2023-05-13 DIAGNOSIS — R7303 Prediabetes: Secondary | ICD-10-CM | POA: Diagnosis not present

## 2023-05-13 DIAGNOSIS — E782 Mixed hyperlipidemia: Secondary | ICD-10-CM | POA: Diagnosis not present

## 2023-05-13 DIAGNOSIS — Z1231 Encounter for screening mammogram for malignant neoplasm of breast: Secondary | ICD-10-CM | POA: Diagnosis not present

## 2023-05-13 DIAGNOSIS — I10 Essential (primary) hypertension: Secondary | ICD-10-CM | POA: Insufficient documentation

## 2023-05-13 MED ORDER — CYCLOBENZAPRINE HCL 10 MG PO TABS: 10.0000 mg | ORAL_TABLET | Freq: Every day | ORAL | 2 refills | Status: DC

## 2023-05-13 MED ORDER — MELOXICAM 7.5 MG PO TABS: 7.5000 mg | ORAL_TABLET | Freq: Every day | ORAL | 2 refills | Status: DC

## 2023-05-13 NOTE — Progress Notes (Addendum)
Established Patient Office Visit  Subjective:  Patient ID: Kathleen Barrera, female    DOB: 04-Sep-1955  Age: 67 y.o. MRN: [id number]  Chief Complaint  Patient presents with   Follow-up    6 month follow up    Patient in office for 6 month follow up, discuss recent lab results. Patient reports feeling well. No complaints today. Discussed recent lab work, Hgb A1c stable, LDL at goal. All other labs WNL.  Mammogram ordered, overdue.  Overdue for colon cancer screening, patient refuses.     No other concerns at this time.   Past Medical History:  Diagnosis Date   Hyperlipidemia    Hypertension     History reviewed. No pertinent surgical history.  Social History   Socioeconomic History   Marital status: Married    Spouse name: Not on file   Number of children: Not on file   Years of education: Not on file   Highest education level: Not on file  Occupational History   Not on file  Tobacco Use   Smoking status: Former    Types: Cigarettes   Smokeless tobacco: Never  Substance and Sexual Activity   Alcohol use: Not on file   Drug use: Not on file   Sexual activity: Not on file  Other Topics Concern   Not on file  Social History Narrative   Not on file   Social Determinants of Health   Financial Resource Strain: Not on file  Food Insecurity: Not on file  Transportation Needs: Not on file  Physical Activity: Not on file  Stress: Not on file  Social Connections: Not on file  Intimate Partner Violence: Not on file    History reviewed. No pertinent family history.  Allergies  Allergen Reactions   Hydrocodone-Acetaminophen     Outpatient Medications Prior to Visit  Medication Sig   amLODipine (NORVASC) 5 MG tablet Take 1 tablet (5 mg total) by mouth every morning.   aspirin EC 81 MG tablet Take 81 mg by mouth once.   Cholecalciferol (VITAMIN D-1000 MAX ST) 25 MCG (1000 UT) tablet Take 1,000 Units by mouth daily.   citalopram (CELEXA) 40 MG tablet TAKE 1  TABLET BY MOUTH DAILY   fexofenadine (ALLEGRA) 180 MG tablet Take 180 mg by mouth daily.   hydrochlorothiazide (HYDRODIURIL) 25 MG tablet TAKE 1 TABLET BY MOUTH EVERY MORNING   losartan (COZAAR) 100 MG tablet TAKE 1 TABLET BY MOUTH DAILY   rosuvastatin (CRESTOR) 20 MG tablet Take 1 tablet (20 mg total) by mouth at bedtime.   [DISCONTINUED] cyclobenzaprine (FLEXERIL) 10 MG tablet Take 10 mg by mouth at bedtime.   [DISCONTINUED] meloxicam (MOBIC) 7.5 MG tablet Take 1 tablet (7.5 mg total) by mouth daily.   [DISCONTINUED] fluconazole (DIFLUCAN) 150 MG tablet Take 1 tablet (150 mg total) by mouth daily. (Patient not taking: Reported on 05/13/2023)   No facility-administered medications prior to visit.    Review of Systems  Constitutional: Negative.   HENT: Negative.    Eyes: Negative.   Respiratory: Negative.  Negative for shortness of breath.   Cardiovascular: Negative.  Negative for chest pain.  Gastrointestinal: Negative.  Negative for abdominal pain, constipation and diarrhea.  Genitourinary: Negative.   Musculoskeletal:  Negative for joint pain and myalgias.  Skin: Negative.   Neurological: Negative.  Negative for dizziness and headaches.  Endo/Heme/Allergies: Negative.   All other systems reviewed and are negative.      Objective:   BP 122/66   Pulse 96  Ht 5' 4.5" (1.638 m)   Wt 148 lb (67.1 kg)   SpO2 94%   BMI 25.01 kg/m   Vitals:   05/13/23 1256  BP: 122/66  Pulse: 96  Height: 5' 4.5" (1.638 m)  Weight: 148 lb (67.1 kg)  SpO2: 94%  BMI (Calculated): 25.02    Physical Exam Vitals and nursing note reviewed.  Constitutional:      Appearance: Normal appearance. She is normal weight.  HENT:     Head: Normocephalic and atraumatic.     Nose: Nose normal.     Mouth/Throat:     Mouth: Mucous membranes are moist.  Eyes:     Extraocular Movements: Extraocular movements intact.     Conjunctiva/sclera: Conjunctivae normal.     Pupils: Pupils are equal, round,  and reactive to light.  Cardiovascular:     Rate and Rhythm: Normal rate and regular rhythm.     Pulses: Normal pulses.     Heart sounds: Normal heart sounds.  Pulmonary:     Effort: Pulmonary effort is normal.     Breath sounds: Normal breath sounds.  Abdominal:     General: Abdomen is flat. Bowel sounds are normal.     Palpations: Abdomen is soft.  Musculoskeletal:        General: Normal range of motion.     Cervical back: Normal range of motion.  Skin:    General: Skin is warm and dry.  Neurological:     General: No focal deficit present.     Mental Status: She is alert and oriented to person, place, and time.  Psychiatric:        Mood and Affect: Mood normal.        Behavior: Behavior normal.        Thought Content: Thought content normal.        Judgment: Judgment normal.      No results found for any visits on 05/13/23.  Recent Results (from the past 2160 hour(s))  Hemoglobin A1c     Status: Abnormal   Collection Time: 05/06/23  8:44 AM  Result Value Ref Range   Hgb A1c MFr Bld 5.7 (H) 4.8 - 5.6 %    Comment:          Prediabetes: 5.7 - 6.4          Diabetes: >6.4          Glycemic control for adults with diabetes: <7.0    Est. average glucose Bld gHb Est-mCnc 117 mg/dL  TSH     Status: None   Collection Time: 05/06/23  8:44 AM  Result Value Ref Range   TSH 1.260 0.450 - 4.500 uIU/mL  CMP14+EGFR     Status: Abnormal   Collection Time: 05/06/23  8:44 AM  Result Value Ref Range   Glucose 116 (H) 70 - 99 mg/dL   BUN 12 8 - 27 mg/dL   Creatinine, Ser 5.40 0.57 - 1.00 mg/dL   eGFR 74 >98 JX/BJY/7.82   BUN/Creatinine Ratio 14 12 - 28   Sodium 135 134 - 144 mmol/L   Potassium 4.5 3.5 - 5.2 mmol/L   Chloride 97 96 - 106 mmol/L   CO2 24 20 - 29 mmol/L   Calcium 9.7 8.7 - 10.3 mg/dL   Total Protein 6.5 6.0 - 8.5 g/dL   Albumin 4.5 3.9 - 4.9 g/dL   Globulin, Total 2.0 1.5 - 4.5 g/dL   Bilirubin Total 0.7 0.0 - 1.2 mg/dL   Alkaline Phosphatase 104  44 - 121 IU/L    AST 21 0 - 40 IU/L   ALT 26 0 - 32 IU/L  Lipid panel     Status: None   Collection Time: 05/06/23  8:44 AM  Result Value Ref Range   Cholesterol, Total 140 100 - 199 mg/dL   Triglycerides 46 0 - 149 mg/dL   HDL 75 >96 mg/dL   VLDL Cholesterol Cal 11 5 - 40 mg/dL   LDL Chol Calc (NIH) 54 0 - 99 mg/dL   Chol/HDL Ratio 1.9 0.0 - 4.4 ratio    Comment:                                   T. Chol/HDL Ratio                                             Men  Women                               1/2 Avg.Risk  3.4    3.3                                   Avg.Risk  5.0    4.4                                2X Avg.Risk  9.6    7.1                                3X Avg.Risk 23.4   11.0       Assessment & Plan:   Continue strict diabetic diet and exercise.  Mammogram order placed.  Consider colon cancer screening.   Problem List Items Addressed This Visit       Cardiovascular and Mediastinum   Essential hypertension, benign     Other   Prediabetes - Primary   Mixed hyperlipidemia   Other Visit Diagnoses     Encounter for screening mammogram for malignant neoplasm of breast       Relevant Orders   MM 3D SCREENING MAMMOGRAM BILATERAL BREAST       Return in about 4 months (around 09/10/2023) for with blood work prior.   Total time spent: 25 minutes  [employer], NP  05/13/2023   This document may have been prepared by Dragon Voice Recognition software and as such may include unintentional dictation errors.

## 2023-06-02 ENCOUNTER — Other Ambulatory Visit: Payer: Self-pay

## 2023-06-02 MED ORDER — FEXOFENADINE HCL 180 MG PO TABS: 180.0000 mg | ORAL_TABLET | Freq: Every day | ORAL | 0 refills | Status: DC

## 2023-08-04 ENCOUNTER — Other Ambulatory Visit: Payer: Self-pay | Admitting: Cardiology

## 2023-08-27 ENCOUNTER — Other Ambulatory Visit: Payer: Self-pay | Admitting: Cardiology

## 2023-09-16 ENCOUNTER — Ambulatory Visit: Payer: PPO | Admitting: Cardiology

## 2023-09-19 ENCOUNTER — Other Ambulatory Visit: Payer: Self-pay

## 2023-09-19 DIAGNOSIS — I10 Essential (primary) hypertension: Secondary | ICD-10-CM

## 2023-09-19 MED ORDER — AMLODIPINE BESYLATE 5 MG PO TABS: 5.0000 mg | ORAL_TABLET | Freq: Every morning | ORAL | 0 refills | Status: DC

## 2023-09-22 ENCOUNTER — Other Ambulatory Visit: Payer: Self-pay | Admitting: Cardiology

## 2023-09-23 ENCOUNTER — Other Ambulatory Visit: Payer: Self-pay

## 2023-09-23 MED ORDER — CITALOPRAM HYDROBROMIDE 40 MG PO TABS: 40.0000 mg | ORAL_TABLET | Freq: Every day | ORAL | 3 refills | Status: AC

## 2023-10-07 ENCOUNTER — Ambulatory Visit: Admitting: Cardiology

## 2023-10-12 ENCOUNTER — Other Ambulatory Visit: Payer: Self-pay

## 2023-10-13 ENCOUNTER — Other Ambulatory Visit

## 2023-10-13 DIAGNOSIS — E782 Mixed hyperlipidemia: Secondary | ICD-10-CM

## 2023-10-13 DIAGNOSIS — I10 Essential (primary) hypertension: Secondary | ICD-10-CM | POA: Diagnosis not present

## 2023-10-13 DIAGNOSIS — E663 Overweight: Secondary | ICD-10-CM | POA: Diagnosis not present

## 2023-10-13 DIAGNOSIS — R7303 Prediabetes: Secondary | ICD-10-CM

## 2023-10-13 MED ORDER — LOSARTAN POTASSIUM 100 MG PO TABS: 100.0000 mg | ORAL_TABLET | Freq: Every day | ORAL | 3 refills | Status: AC

## 2023-10-13 MED ORDER — HYDROCHLOROTHIAZIDE 25 MG PO TABS: 25.0000 mg | ORAL_TABLET | Freq: Every morning | ORAL | 3 refills | Status: AC

## 2023-10-14 LAB — CMP14+EGFR
ALT: 30 IU/L (ref 0–32)
AST: 24 IU/L (ref 0–40)
Albumin: 4.8 g/dL (ref 3.9–4.9)
Alkaline Phosphatase: 107 IU/L (ref 44–121)
BUN/Creatinine Ratio: 13 (ref 12–28)
BUN: 11 mg/dL (ref 8–27)
Bilirubin Total: 0.5 mg/dL (ref 0.0–1.2)
CO2: 20 mmol/L (ref 20–29)
Calcium: 10.2 mg/dL (ref 8.7–10.3)
Chloride: 96 mmol/L (ref 96–106)
Creatinine, Ser: 0.82 mg/dL (ref 0.57–1.00)
Globulin, Total: 1.8 g/dL (ref 1.5–4.5)
Glucose: 112 mg/dL — ABNORMAL HIGH (ref 70–99)
Potassium: 4.4 mmol/L (ref 3.5–5.2)
Sodium: 135 mmol/L (ref 134–144)
Total Protein: 6.6 g/dL (ref 6.0–8.5)
eGFR: 78 mL/min/{1.73_m2} (ref >59)

## 2023-10-14 LAB — LIPID PANEL
Chol/HDL Ratio: 2 ratio (ref 0.0–4.4)
Cholesterol, Total: 142 mg/dL (ref 100–199)
HDL: 71 mg/dL (ref >39)
LDL Chol Calc (NIH): 58 mg/dL (ref 0–99)
Triglycerides: 66 mg/dL (ref 0–149)
VLDL Cholesterol Cal: 13 mg/dL (ref 5–40)

## 2023-10-14 LAB — HEMOGLOBIN A1C
Est. average glucose Bld gHb Est-mCnc: 108 mg/dL
Hgb A1c MFr Bld: 5.4 % (ref 4.8–5.6)

## 2023-10-14 LAB — TSH: TSH: 1.32 u[IU]/mL (ref 0.450–4.500)

## 2023-10-20 ENCOUNTER — Ambulatory Visit
Admission: RE | Admit: 2023-10-20 | Discharge: 2023-10-20 | Disposition: A | Discharge status: Home / Self Care | Source: Ambulatory Visit | Attending: Cardiology | Admitting: Cardiology

## 2023-10-20 DIAGNOSIS — Z1231 Encounter for screening mammogram for malignant neoplasm of breast: Secondary | ICD-10-CM | POA: Diagnosis not present

## 2023-10-21 ENCOUNTER — Ambulatory Visit: Admitting: Cardiology

## 2023-10-21 ENCOUNTER — Encounter: Payer: Self-pay | Admitting: Cardiology

## 2023-10-21 VITALS — BP 138/80 | HR 90 | Ht 64.5 in | Wt 146.0 lb

## 2023-10-21 DIAGNOSIS — Z1211 Encounter for screening for malignant neoplasm of colon: Secondary | ICD-10-CM

## 2023-10-21 DIAGNOSIS — E782 Mixed hyperlipidemia: Secondary | ICD-10-CM

## 2023-10-21 DIAGNOSIS — I10 Essential (primary) hypertension: Secondary | ICD-10-CM | POA: Diagnosis not present

## 2023-10-21 DIAGNOSIS — R7303 Prediabetes: Secondary | ICD-10-CM

## 2023-10-21 MED ORDER — CYCLOBENZAPRINE HCL 10 MG PO TABS: 10.0000 mg | ORAL_TABLET | Freq: Every day | ORAL | 2 refills | Status: DC

## 2023-10-21 MED ORDER — HYDROCORTISONE (PERIANAL) 2.5 % EX CREA: 1.0000 | TOPICAL_CREAM | Freq: Two times a day (BID) | CUTANEOUS | 0 refills | Status: DC

## 2023-10-21 MED ORDER — AMLODIPINE BESYLATE 5 MG PO TABS: 5.0000 mg | ORAL_TABLET | Freq: Every morning | ORAL | 3 refills | Status: AC

## 2023-10-21 MED ORDER — MELOXICAM 7.5 MG PO TABS: 7.5000 mg | ORAL_TABLET | Freq: Every day | ORAL | 2 refills | Status: DC

## 2023-10-21 NOTE — Progress Notes (Signed)
 Established Patient Office Visit  Subjective:  Patient ID: Kathleen Barrera, female    DOB: 02-23-1956  Age: 68 y.o. MRN: [id number]  Chief Complaint  Patient presents with   Follow-up    4 Months Lab Results    Patient in office for 4 month follow up, discuss recent lab results. Patient reports doing well. No new complaints.  Patient has a history of hemorrhoids, patient reports they have been worse, OTC medications not working. Patient has used procto-med in the past with relief. Will send in a refill.  Discussed recent lab results. Hgb A1c improved, no longer pre diabetic. LDL at goal. Normal kidney function. All other labs WNL.  Mammogram yesterday, results pending. Over due for colonoscopy. Referral sent.     No other concerns at this time.   Past Medical History:  Diagnosis Date   Hyperlipidemia    Hypertension     History reviewed. No pertinent surgical history.  Social History   Socioeconomic History   Marital status: Married    Spouse name: Not on file   Number of children: Not on file   Years of education: Not on file   Highest education level: Not on file  Occupational History   Not on file  Tobacco Use   Smoking status: Former    Types: Cigarettes   Smokeless tobacco: Never  Substance and Sexual Activity   Alcohol use: Not on file   Drug use: Not on file   Sexual activity: Not on file  Other Topics Concern   Not on file  Social History Narrative   Not on file   Social Drivers of Health   Financial Resource Strain: Not on file  Food Insecurity: Not on file  Transportation Needs: Not on file  Physical Activity: Not on file  Stress: Not on file  Social Connections: Not on file  Intimate Partner Violence: Not on file    History reviewed. No pertinent family history.  Allergies  Allergen Reactions   Hydrocodone-Acetaminophen     Outpatient Medications Prior to Visit  Medication Sig   aspirin EC 81 MG tablet Take 81 mg by mouth once.    Cholecalciferol (VITAMIN D-1000 MAX ST) 25 MCG (1000 UT) tablet Take 1,000 Units by mouth daily.   citalopram  (CELEXA ) 40 MG tablet Take 1 tablet (40 mg total) by mouth daily.   fexofenadine  (ALLEGRA ) 180 MG tablet TAKE 1 TABLET BY MOUTH DAILY   hydrochlorothiazide  (HYDRODIURIL ) 25 MG tablet Take 1 tablet (25 mg total) by mouth every morning.   losartan  (COZAAR ) 100 MG tablet Take 1 tablet (100 mg total) by mouth daily.   rosuvastatin  (CRESTOR ) 20 MG tablet TAKE 1 TABLET BY MOUTH AT BEDTIME   [DISCONTINUED] amLODipine  (NORVASC ) 5 MG tablet Take 1 tablet (5 mg total) by mouth every morning.   [DISCONTINUED] cyclobenzaprine  (FLEXERIL ) 10 MG tablet Take 1 tablet (10 mg total) by mouth at bedtime.   [DISCONTINUED] meloxicam  (MOBIC ) 7.5 MG tablet TAKE 1 TABLET BY MOUTH DAILY   No facility-administered medications prior to visit.    Review of Systems  Constitutional: Negative.   HENT: Negative.    Eyes: Negative.   Respiratory: Negative.  Negative for shortness of breath.   Cardiovascular: Negative.  Negative for chest pain.  Gastrointestinal: Negative.  Negative for abdominal pain, constipation and diarrhea.  Genitourinary: Negative.   Musculoskeletal:  Negative for joint pain and myalgias.  Skin: Negative.   Neurological: Negative.  Negative for dizziness and headaches.  Endo/Heme/Allergies: Negative.  All other systems reviewed and are negative.      Objective:   BP 138/80   Pulse 90   Ht 5' 4.5" (1.638 m)   Wt 146 lb (66.2 kg)   SpO2 97%   BMI 24.67 kg/m   Vitals:   10/21/23 1030  BP: 138/80  Pulse: 90  Height: 5' 4.5" (1.638 m)  Weight: 146 lb (66.2 kg)  SpO2: 97%  BMI (Calculated): 24.68    Physical Exam Vitals and nursing note reviewed.  Constitutional:      Appearance: Normal appearance. She is normal weight.  HENT:     Head: Normocephalic and atraumatic.     Nose: Nose normal.     Mouth/Throat:     Mouth: Mucous membranes are moist.  Eyes:      Extraocular Movements: Extraocular movements intact.     Conjunctiva/sclera: Conjunctivae normal.     Pupils: Pupils are equal, round, and reactive to light.  Cardiovascular:     Rate and Rhythm: Normal rate and regular rhythm.     Pulses: Normal pulses.     Heart sounds: Normal heart sounds.  Pulmonary:     Effort: Pulmonary effort is normal.     Breath sounds: Normal breath sounds.  Abdominal:     General: Abdomen is flat. Bowel sounds are normal.     Palpations: Abdomen is soft.  Musculoskeletal:        General: Normal range of motion.     Cervical back: Normal range of motion.  Skin:    General: Skin is warm and dry.  Neurological:     General: No focal deficit present.     Mental Status: She is alert and oriented to person, place, and time.  Psychiatric:        Mood and Affect: Mood normal.        Behavior: Behavior normal.        Thought Content: Thought content normal.        Judgment: Judgment normal.      No results found for any visits on 10/21/23.  Recent Results (from the past 2160 hours)  Hemoglobin A1c     Status: None   Collection Time: 10/13/23  8:19 AM  Result Value Ref Range   Hgb A1c MFr Bld 5.4 4.8 - 5.6 %    Comment:          Prediabetes: 5.7 - 6.4          Diabetes: >6.4          Glycemic control for adults with diabetes: <7.0    Est. average glucose Bld gHb Est-mCnc 108 mg/dL  TSH     Status: None   Collection Time: 10/13/23  8:19 AM  Result Value Ref Range   TSH 1.320 0.450 - 4.500 uIU/mL  CMP14+EGFR     Status: Abnormal   Collection Time: 10/13/23  8:19 AM  Result Value Ref Range   Glucose 112 (H) 70 - 99 mg/dL   BUN 11 8 - 27 mg/dL   Creatinine, Ser 4.09 0.57 - 1.00 mg/dL   eGFR 78 >81 XB/JYN/8.29   BUN/Creatinine Ratio 13 12 - 28   Sodium 135 134 - 144 mmol/L   Potassium 4.4 3.5 - 5.2 mmol/L   Chloride 96 96 - 106 mmol/L   CO2 20 20 - 29 mmol/L   Calcium  10.2 8.7 - 10.3 mg/dL   Total Protein 6.6 6.0 - 8.5 g/dL   Albumin 4.8 3.9 -  4.9 g/dL  Globulin, Total 1.8 1.5 - 4.5 g/dL   Bilirubin Total 0.5 0.0 - 1.2 mg/dL   Alkaline Phosphatase 107 44 - 121 IU/L   AST 24 0 - 40 IU/L   ALT 30 0 - 32 IU/L  Lipid panel     Status: None   Collection Time: 10/13/23  8:19 AM  Result Value Ref Range   Cholesterol, Total 142 100 - 199 mg/dL   Triglycerides 66 0 - 149 mg/dL   HDL 71 >16 mg/dL   VLDL Cholesterol Cal 13 5 - 40 mg/dL   LDL Chol Calc (NIH) 58 0 - 99 mg/dL   Chol/HDL Ratio 2.0 0.0 - 4.4 ratio    Comment:                                   T. Chol/HDL Ratio                                             Men  Women                               1/2 Avg.Risk  3.4    3.3                                   Avg.Risk  5.0    4.4                                2X Avg.Risk  9.6    7.1                                3X Avg.Risk 23.4   11.0       Assessment & Plan:  Procto-med sent in  Referral for colonoscopy sent Continue same medications  Problem List Items Addressed This Visit       Cardiovascular and Mediastinum   Essential hypertension, benign - Primary   Relevant Medications   amLODipine  (NORVASC ) 5 MG tablet     Other   Prediabetes   Mixed hyperlipidemia   Relevant Medications   amLODipine  (NORVASC ) 5 MG tablet   Other Visit Diagnoses       Colon cancer screening       Relevant Orders   Ambulatory referral to Gastroenterology       Return in about 4 months (around 02/20/2024) for with fasting lab work prior.   Total time spent: 25 minutes  Google, NP  10/21/2023   This document may have been prepared by Dragon Voice Recognition software and as such may include unintentional dictation errors.

## 2023-12-01 ENCOUNTER — Other Ambulatory Visit: Payer: Self-pay | Admitting: Cardiology

## 2024-02-21 NOTE — Progress Notes (Signed)
   02/21/2024  Patient ID: Kathleen Barrera, female   DOB: 11-18-1955, 68 y.o.   MRN: [id number]  Pharmacy Quality Measure Review  This patient is appearing on a report for being at risk of failing the adherence measure for cholesterol (statin) medications this calendar year.   Medication: Rosuvastatin  Last fill date: 12/21/23 for 90 day supply  Insurance report was not up to date. No action needed at this time.   Jon VEAR Lindau, PharmD Clinical Pharmacist [phone]

## 2024-02-23 ENCOUNTER — Encounter: Payer: Self-pay | Admitting: Cardiology

## 2024-02-23 ENCOUNTER — Ambulatory Visit (INDEPENDENT_AMBULATORY_CARE_PROVIDER_SITE_OTHER): Admitting: Cardiology

## 2024-02-23 VITALS — BP 130/80 | HR 98 | Ht 64.0 in | Wt 151.0 lb

## 2024-02-23 DIAGNOSIS — R7303 Prediabetes: Secondary | ICD-10-CM | POA: Diagnosis not present

## 2024-02-23 DIAGNOSIS — I10 Essential (primary) hypertension: Secondary | ICD-10-CM | POA: Diagnosis not present

## 2024-02-23 DIAGNOSIS — E782 Mixed hyperlipidemia: Secondary | ICD-10-CM

## 2024-02-23 MED ORDER — HYDROCORTISONE (PERIANAL) 2.5 % EX CREA
1.0000 | TOPICAL_CREAM | Freq: Two times a day (BID) | CUTANEOUS | 3 refills | Status: AC
Start: 2024-02-23 — End: ?

## 2024-02-23 MED ORDER — FEXOFENADINE HCL 180 MG PO TABS: 180.0000 mg | ORAL_TABLET | Freq: Every day | ORAL | 1 refills | Status: AC

## 2024-02-23 NOTE — Progress Notes (Signed)
 Established Patient Office Visit  Subjective:  Patient ID: Kathleen Barrera, female    DOB: [birth year]  Age: 68 y.o. MRN: [id number]  Chief Complaint  Patient presents with   Results    4 month fasting lab results     Patient in office for 4 month follow up, discuss recent lab results. Patient doing well, no complaints today. Patient did not have blood work done, [employer] only covers it every 6 months.  Continue same medications.     No other concerns at this time.   Past Medical History:  Diagnosis Date   Hyperlipidemia    Hypertension     History reviewed. No pertinent surgical history.  Social History   Socioeconomic History   Marital status: Married    Spouse name: Not on file   Number of children: Not on file   Years of education: Not on file   Highest education level: Not on file  Occupational History   Not on file  Tobacco Use   Smoking status: Former    Types: Cigarettes   Smokeless tobacco: Never  Substance and Sexual Activity   Alcohol use: Not on file   Drug use: Not on file   Sexual activity: Not on file  Other Topics Concern   Not on file  Social History Narrative   Not on file   Social Drivers of Health   Financial Resource Strain: Not on file  Food Insecurity: Not on file  Transportation Needs: Not on file  Physical Activity: Not on file  Stress: Not on file  Social Connections: Not on file  Intimate Partner Violence: Not on file    History reviewed. No pertinent family history.  Allergies  Allergen Reactions   Hydrocodone-Acetaminophen     Outpatient Medications Prior to Visit  Medication Sig   amLODipine  (NORVASC ) 5 MG tablet Take 1 tablet (5 mg total) by mouth every morning.   aspirin EC 81 MG tablet Take 81 mg by mouth once.   Cholecalciferol (VITAMIN D-1000 MAX ST) 25 MCG (1000 UT) tablet Take 1,000 Units by mouth daily.   citalopram  (CELEXA ) 40 MG tablet Take 1 tablet (40 mg total) by mouth daily.   cyclobenzaprine   (FLEXERIL ) 10 MG tablet Take 1 tablet (10 mg total) by mouth at bedtime.   hydrochlorothiazide  (HYDRODIURIL ) 25 MG tablet Take 1 tablet (25 mg total) by mouth every morning.   losartan  (COZAAR ) 100 MG tablet Take 1 tablet (100 mg total) by mouth daily.   meloxicam  (MOBIC ) 7.5 MG tablet Take 1 tablet (7.5 mg total) by mouth daily.   rosuvastatin  (CRESTOR ) 20 MG tablet TAKE 1 TABLET BY MOUTH AT BEDTIME   [DISCONTINUED] fexofenadine  (ALLEGRA ) 180 MG tablet TAKE 1 TABLET BY MOUTH DAILY   [DISCONTINUED] hydrocortisone  (PROCTO-MED HC ) 2.5 % rectal cream Place 1 Application rectally 2 (two) times daily.   No facility-administered medications prior to visit.    Review of Systems  Constitutional: Negative.   HENT: Negative.    Eyes: Negative.   Respiratory: Negative.  Negative for shortness of breath.   Cardiovascular: Negative.  Negative for chest pain.  Gastrointestinal: Negative.  Negative for abdominal pain, constipation and diarrhea.  Genitourinary: Negative.   Musculoskeletal:  Negative for joint pain and myalgias.  Skin: Negative.   Neurological: Negative.  Negative for dizziness and headaches.  Endo/Heme/Allergies: Negative.   All other systems reviewed and are negative.      Objective:   BP 130/80   Pulse 98   Ht  5' 4 (1.626 m)   Wt 151 lb (68.5 kg)   SpO2 97%   BMI 25.92 kg/m   Vitals:   02/23/24 0900  BP: 130/80  Pulse: 98  Height: 5' 4 (1.626 m)  Weight: 151 lb (68.5 kg)  SpO2: 97%  BMI (Calculated): 25.91    Physical Exam Vitals and nursing note reviewed.  Constitutional:      Appearance: Normal appearance. She is normal weight.  HENT:     Head: Normocephalic and atraumatic.     Nose: Nose normal.     Mouth/Throat:     Mouth: Mucous membranes are moist.  Eyes:     Extraocular Movements: Extraocular movements intact.     Conjunctiva/sclera: Conjunctivae normal.     Pupils: Pupils are equal, round, and reactive to light.  Cardiovascular:     Rate and  Rhythm: Normal rate and regular rhythm.     Pulses: Normal pulses.     Heart sounds: Normal heart sounds.  Pulmonary:     Effort: Pulmonary effort is normal.     Breath sounds: Normal breath sounds.  Abdominal:     General: Abdomen is flat. Bowel sounds are normal.     Palpations: Abdomen is soft.  Musculoskeletal:        General: Normal range of motion.     Cervical back: Normal range of motion.  Skin:    General: Skin is warm and dry.  Neurological:     General: No focal deficit present.     Mental Status: She is alert and oriented to person, place, and time.  Psychiatric:        Mood and Affect: Mood normal.        Behavior: Behavior normal.        Thought Content: Thought content normal.        Judgment: Judgment normal.      No results found for any visits on 02/23/24.  No results found for this or any previous visit (from the past 2160 hours).    Assessment & Plan:  Continue same medications.   Problem List Items Addressed This Visit       Cardiovascular and Mediastinum   Essential hypertension, benign - Primary     Other   Prediabetes   Mixed hyperlipidemia    Return in about 4 months (around 06/24/2024) for fasting labs prior.   Total time spent: 25 minutes  Google, NP  02/23/2024   This document may have been prepared by Dragon Voice Recognition software and as such may include unintentional dictation errors.

## 2024-03-19 ENCOUNTER — Other Ambulatory Visit: Payer: Self-pay | Admitting: Cardiology

## 2024-03-19 NOTE — Progress Notes (Signed)
   03/19/2024  Patient ID: Kathleen Barrera, female   DOB: Mar 10, 1956, 68 y.o.   MRN: [id number]  Pharmacy Quality Measure Review  This patient is appearing on a report for being at risk of failing the adherence measure for hypertension (ACEi/ARB) medications this calendar year.   Medication: Losartan  Last fill date: 01/11/24 for 90 day supply  Insurance report was not up to date. No action needed at this time.   Jon VEAR Lindau, PharmD Clinical Pharmacist [phone]

## 2024-03-27 NOTE — Progress Notes (Signed)
   03/27/2024  Patient ID: Kathleen Barrera, female   DOB: 01/23/1956, 68 y.o.   MRN: [id number]  Pharmacy Quality Measure Review  This patient is appearing on a report for being at risk of failing the adherence measure for cholesterol (statin) medications this calendar year.   Medication: Rosuvastatin  Last fill date: 03/20/24 for 90 day supply  Insurance report was not up to date. No action needed at this time.   Jon VEAR Lindau, PharmD Clinical Pharmacist [phone]

## 2024-04-17 NOTE — Progress Notes (Signed)
   04/17/2024  Patient ID: Kathleen Barrera, female   DOB: [birth year], 68 y.o.   MRN: [id number]  Pharmacy Quality Measure Review  This patient is appearing on a report for being at risk of failing the adherence measure for hypertension (ACEi/ARB) medications this calendar year.   Medication: Losartan  Last fill date: 04/12/24 for 90 day supply  Insurance report was not up to date. No action needed at this time.   Kathleen Barrera, PharmD Clinical Pharmacist [phone]

## 2024-05-29 ENCOUNTER — Ambulatory Visit: Admitting: Cardiology

## 2024-05-29 ENCOUNTER — Encounter: Payer: Self-pay | Admitting: Cardiology

## 2024-05-29 VITALS — BP 122/84 | HR 99 | Ht 64.0 in | Wt 150.6 lb

## 2024-05-29 DIAGNOSIS — I10 Essential (primary) hypertension: Secondary | ICD-10-CM

## 2024-05-29 DIAGNOSIS — R7303 Prediabetes: Secondary | ICD-10-CM

## 2024-05-29 DIAGNOSIS — M25562 Pain in left knee: Secondary | ICD-10-CM | POA: Diagnosis not present

## 2024-05-29 DIAGNOSIS — F419 Anxiety disorder, unspecified: Secondary | ICD-10-CM

## 2024-05-29 DIAGNOSIS — E782 Mixed hyperlipidemia: Secondary | ICD-10-CM | POA: Diagnosis not present

## 2024-05-29 MED ORDER — METHYLPREDNISOLONE ACETATE 40 MG/ML IJ SUSP
40.0000 mg | Freq: Once | INTRAMUSCULAR | Status: AC
  Administered 2024-05-29: 40 mg via INTRAMUSCULAR

## 2024-05-29 MED ORDER — PRAVASTATIN SODIUM 10 MG PO TABS: 10.0000 mg | ORAL_TABLET | Freq: Every day | ORAL | 1 refills | Status: AC

## 2024-05-29 MED ORDER — MELOXICAM 7.5 MG PO TABS: 7.5000 mg | ORAL_TABLET | Freq: Two times a day (BID) | ORAL | 2 refills | Status: AC

## 2024-05-29 NOTE — Progress Notes (Signed)
 Established Patient Office Visit  Subjective:  Patient ID: Kathleen Barrera, female    DOB: 06/06/1956  Age: 68 y.o. MRN: [id number]  Chief Complaint  Patient presents with   Acute Visit    Left knee pain since 11/28.     Patient in office for an acute visit, complaining of bilateral knee pain, left knee worse than right. Patient reports being off her statin for a short time, while not taking the rosuvastatin , her knees felt better, no pain. Pain returned when she restarted the rosuvastatin , will change to low dose pravastatin.  Patient reports pain also gets worse with the changes in the weather. Taking meloxicam , occasionally twice daily. Also recommend Voltaren gel. Will do a prednisone injection in office today.  Blood pressure well controlled today.  No recent lab work. Will return when fasting prior to next visit.  Anxiety controlled on Celexa . Continue current medications.     No other concerns at this time.   Past Medical History:  Diagnosis Date   Acute pain of right shoulder 08/26/2022   Hyperlipidemia    Hypertension    Tendonitis of elbow, right 08/26/2022    History reviewed. No pertinent surgical history.  Social History   Socioeconomic History   Marital status: Married    Spouse name: Not on file   Number of children: Not on file   Years of education: Not on file   Highest education level: Not on file  Occupational History   Not on file  Tobacco Use   Smoking status: Former    Types: Cigarettes   Smokeless tobacco: Never  Substance and Sexual Activity   Alcohol use: Not on file   Drug use: Not on file   Sexual activity: Not on file  Other Topics Concern   Not on file  Social History Narrative   Not on file   Social Drivers of Health   Financial Resource Strain: Not on file  Food Insecurity: Not on file  Transportation Needs: Not on file  Physical Activity: Not on file  Stress: Not on file  Social Connections: Not on file  Intimate Partner  Violence: Not on file    History reviewed. No pertinent family history.  Allergies  Allergen Reactions   Hydrocodone-Acetaminophen     Outpatient Medications Prior to Visit  Medication Sig Note   amLODipine  (NORVASC ) 5 MG tablet Take 1 tablet (5 mg total) by mouth every morning.    aspirin EC 81 MG tablet Take 81 mg by mouth once.    Cholecalciferol (VITAMIN D-1000 MAX ST) 25 MCG (1000 UT) tablet Take 1,000 Units by mouth daily.    citalopram  (CELEXA ) 40 MG tablet Take 1 tablet (40 mg total) by mouth daily.    cyclobenzaprine  (FLEXERIL ) 10 MG tablet Take 1 tablet (10 mg total) by mouth at bedtime.    fexofenadine  (ALLEGRA ) 180 MG tablet Take 1 tablet (180 mg total) by mouth daily.    hydrochlorothiazide  (HYDRODIURIL ) 25 MG tablet Take 1 tablet (25 mg total) by mouth every morning.    hydrocortisone  (PROCTO-MED HC ) 2.5 % rectal cream Place 1 Application rectally 2 (two) times daily.    losartan  (COZAAR ) 100 MG tablet Take 1 tablet (100 mg total) by mouth daily.    [DISCONTINUED] meloxicam  (MOBIC ) 7.5 MG tablet Take 1 tablet (7.5 mg total) by mouth daily.    [DISCONTINUED] rosuvastatin  (CRESTOR ) 20 MG tablet TAKE 1 TABLET BY MOUTH AT BEDTIME 05/29/2024: joint pain   No facility-administered medications prior to  visit.    Review of Systems  Constitutional: Negative.   HENT: Negative.    Eyes: Negative.   Respiratory: Negative.  Negative for shortness of breath.   Cardiovascular: Negative.  Negative for chest pain.  Gastrointestinal: Negative.  Negative for abdominal pain, constipation and diarrhea.  Genitourinary: Negative.   Musculoskeletal:  Negative for joint pain and myalgias.  Skin: Negative.   Neurological: Negative.  Negative for dizziness and headaches.  Endo/Heme/Allergies: Negative.   All other systems reviewed and are negative.      Objective:   BP 122/84   Pulse 99   Ht 5' 4 (1.626 m)   Wt 150 lb 9.6 oz (68.3 kg)   SpO2 97%   BMI 25.85 kg/m   Vitals:    05/29/24 0955  BP: 122/84  Pulse: 99  Height: 5' 4 (1.626 m)  Weight: 150 lb 9.6 oz (68.3 kg)  SpO2: 97%  BMI (Calculated): 25.84    Physical Exam Vitals and nursing note reviewed.  Constitutional:      Appearance: Normal appearance. She is normal weight.  HENT:     Head: Normocephalic and atraumatic.     Nose: Nose normal.     Mouth/Throat:     Mouth: Mucous membranes are moist.  Eyes:     Extraocular Movements: Extraocular movements intact.     Conjunctiva/sclera: Conjunctivae normal.     Pupils: Pupils are equal, round, and reactive to light.  Cardiovascular:     Rate and Rhythm: Normal rate and regular rhythm.     Pulses: Normal pulses.     Heart sounds: Normal heart sounds.  Pulmonary:     Effort: Pulmonary effort is normal.     Breath sounds: Normal breath sounds.  Abdominal:     General: Abdomen is flat. Bowel sounds are normal.     Palpations: Abdomen is soft.  Musculoskeletal:        General: Normal range of motion.     Cervical back: Normal range of motion.  Skin:    General: Skin is warm and dry.  Neurological:     General: No focal deficit present.     Mental Status: She is alert and oriented to person, place, and time.  Psychiatric:        Mood and Affect: Mood normal.        Behavior: Behavior normal.        Thought Content: Thought content normal.        Judgment: Judgment normal.      No results found for any visits on 05/29/24.  No results found for this or any previous visit (from the past 2160 hours).    Assessment & Plan:  Pravastatin Voltaren gel Prednisone injection today Continue current medications  Problem List Items Addressed This Visit       Cardiovascular and Mediastinum   Essential hypertension, benign   Relevant Medications   pravastatin (PRAVACHOL) 10 MG tablet     Other   Prediabetes   Anxiety   Mixed hyperlipidemia   Relevant Medications   pravastatin (PRAVACHOL) 10 MG tablet   Acute pain of left knee -  Primary    Return if symptoms worsen or fail to improve, for as needed.   Total time spent: 25 minutes. This time includes review of previous notes and results and patient face to face interaction during today's visit.    Jeoffrey Pollen, NP  05/29/2024   This document may have been prepared by [employer] Voice Recognition software and  as such may include unintentional dictation errors.

## 2024-07-06 ENCOUNTER — Ambulatory Visit: Admitting: Cardiology

## 2024-08-02 ENCOUNTER — Other Ambulatory Visit: Payer: Self-pay | Admitting: Cardiology

## 2024-08-17 ENCOUNTER — Ambulatory Visit: Admitting: Cardiology
